# Patient Record
Sex: Female | Born: 2005 | Hispanic: Yes | Marital: Single | Smoking: Never smoker
Health system: Southern US, Community
[De-identification: ages and names within clinical notes are randomized; demographics above are authoritative.]

## PROBLEM LIST (undated history)

## (undated) DIAGNOSIS — J45909 Unspecified asthma, uncomplicated: Secondary | ICD-10-CM

## (undated) HISTORY — PX: NO PAST SURGERIES: SHX2092

---
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
## 2024-02-10 ENCOUNTER — Other Ambulatory Visit: Payer: Self-pay | Admitting: Obstetrics

## 2024-02-17 ENCOUNTER — Other Ambulatory Visit: Payer: Self-pay | Admitting: Obstetrics

## 2024-02-17 NOTE — Telephone Encounter (Signed)
Pt needs to schedule annual

## 2024-04-03 ENCOUNTER — Encounter: Payer: Self-pay | Admitting: Obstetrics

## 2024-04-03 ENCOUNTER — Other Ambulatory Visit (HOSPITAL_COMMUNITY)
Admission: RE | Admit: 2024-04-03 | Discharge: 2024-04-03 | Disposition: A | Discharge status: Home / Self Care | Source: Ambulatory Visit | Attending: Obstetrics | Admitting: Obstetrics

## 2024-04-03 ENCOUNTER — Ambulatory Visit (INDEPENDENT_AMBULATORY_CARE_PROVIDER_SITE_OTHER): Admitting: Obstetrics

## 2024-04-03 VITALS — BP 115/62 | HR 63 | Ht 64.0 in | Wt 157.0 lb

## 2024-04-03 DIAGNOSIS — N898 Other specified noninflammatory disorders of vagina: Secondary | ICD-10-CM | POA: Insufficient documentation

## 2024-04-03 NOTE — Progress Notes (Signed)
    GYNECOLOGY PROGRESS NOTE  Subjective:  PCP: Francy Ishikawa, MD  Patient ID: Erin Rhodes, female    DOB: [birth year], 18 y.o.   MRN: [id number]  HPI  Patient is a 18 y.o. G0P0000 female who presents for vaginal itching started Tuesday, suspects she has a yeast infection, had one last year and the symptoms are the same. No discharge or odor. She also has some "cracking" on her perineal skin that is painful, especially when she wipes. Requesting to self-swab and declines pelvic exam today.   The following portions of the patient's history were reviewed and updated as appropriate: allergies, current medications, past family history, past medical history, past social history, past surgical history, and problem list.  Review of Systems Pertinent items are noted in HPI.   Objective:   Blood pressure 115/62, pulse 63, height 5\' 4"  (1.626 m), weight 157 lb (71.2 kg), last menstrual period 03/15/2024. Body mass index is 26.95 kg/m.  General appearance: alert and cooperative Pelvic: deferred Extremities: extremities normal, atraumatic, no cyanosis or edema Neurologic: Grossly normal   Assessment/Plan:   1. Vaginal itching   -Self-swab done today per pt's preference -Will await results for treatment; discussed OTC Monistat if wanting to treat sooner -Pericare for cracked perineal skin discussed, did not examine per pt's preference  -Follow up if symptoms not improving    Sofia Dunn, DO [facility]

## 2024-04-05 LAB — CERVICOVAGINAL ANCILLARY ONLY
Bacterial Vaginitis (gardnerella): NEGATIVE
Candida Glabrata: NEGATIVE
Candida Vaginitis: POSITIVE — AB
Chlamydia: NEGATIVE
Comment: NEGATIVE
Comment: NEGATIVE
Comment: NEGATIVE
Comment: NEGATIVE
Comment: NEGATIVE
Comment: NORMAL
Neisseria Gonorrhea: NEGATIVE
Trichomonas: NEGATIVE

## 2024-04-06 ENCOUNTER — Other Ambulatory Visit: Payer: Self-pay | Admitting: Obstetrics

## 2024-04-06 ENCOUNTER — Encounter: Payer: Self-pay | Admitting: Obstetrics

## 2024-04-06 DIAGNOSIS — B3731 Acute candidiasis of vulva and vagina: Secondary | ICD-10-CM

## 2024-04-06 MED ORDER — FLUCONAZOLE 150 MG PO TABS: 150.0000 mg | ORAL_TABLET | ORAL | 0 refills | Status: AC

## 2024-04-06 NOTE — Progress Notes (Signed)
 Yeast infection, Rx for Diflucan

## 2024-04-24 ENCOUNTER — Encounter: Payer: Self-pay | Admitting: Emergency Medicine

## 2024-04-24 ENCOUNTER — Emergency Department
Admission: EM | Admit: 2024-04-24 | Discharge: 2024-04-24 | Disposition: A | Discharge status: Home / Self Care | Attending: Emergency Medicine | Admitting: Emergency Medicine

## 2024-04-24 ENCOUNTER — Emergency Department

## 2024-04-24 DIAGNOSIS — W1839XA Other fall on same level, initial encounter: Secondary | ICD-10-CM | POA: Diagnosis not present

## 2024-04-24 DIAGNOSIS — S62314A Displaced fracture of base of fourth metacarpal bone, right hand, initial encounter for closed fracture: Secondary | ICD-10-CM | POA: Insufficient documentation

## 2024-04-24 DIAGNOSIS — Y9389 Activity, other specified: Secondary | ICD-10-CM | POA: Diagnosis not present

## 2024-04-24 DIAGNOSIS — S6391XA Sprain of unspecified part of right wrist and hand, initial encounter: Secondary | ICD-10-CM

## 2024-04-24 DIAGNOSIS — M79641 Pain in right hand: Secondary | ICD-10-CM | POA: Diagnosis present

## 2024-04-24 MED ORDER — IBUPROFEN 600 MG PO TABS
600.0000 mg | ORAL_TABLET | Freq: Once | ORAL | Status: AC
  Administered 2024-04-24: 600 mg via ORAL
  Filled 2024-04-24: qty 1

## 2024-04-24 NOTE — Discharge Instructions (Signed)
 Please wear a Velcro wrist brace at all times until you follow-up with orthopedist.  You may remove brace to shower.  Alternate Tylenol  and ibuprofen as needed for pain.  You may apply ice 20 minutes every hour to the right hand to help with swelling.

## 2024-04-24 NOTE — ED Provider Notes (Signed)
 [place] [facility] DEPARTMENT AT [facility] Provider Note   CSN: [id number] Arrival date & time: 04/24/24  2141     History  Chief Complaint  Patient presents with   Hand Injury    Erin Rhodes is a 18 y.o. female presents to the emergency department for evaluation of right hand pain.  Patient states earlier today she was riding a mechanical bull, had her right hand called and the strap of the bowl and twisted her right hand.  She has pain and swelling to the dorsum of the right hand, most the pain along the base of the metacarpals.  No open injuries.  Sensation is intact distally.  No other injury  HPI     Home Medications Prior to Admission medications   Medication Sig Start Date End Date Taking? Authorizing Provider  fluconazole  (DIFLUCAN ) 150 MG tablet Take 1 tablet (150 mg total) by mouth every 3 (three) days. 04/06/24   Sofia Dunn, MD  albuterol  (VENTOLIN  HFA) 108 (90 Base) MCG/ACT inhaler Inhale 2 puffs into the lungs every 6 (six) hours as needed for shortness of breath. 11/29/20   Menshew, Raye Cai, PA-C  etonogestrel -ethinyl estradiol  (HALOETTE) 0.12-0.015 MG/24HR vaginal ring INSERT 1 RING VAGINALLY FOR 3 WEEKS THEN REMOVE FOR 1 WEEK 02/17/24   Phylliss Brenner, CNM      Allergies    Patient has no known allergies.    Review of Systems   Review of Systems  Physical Exam Updated Vital Signs BP 110/67   Pulse 93   Temp 98.3 F (36.8 C) (Oral)   Resp 18   Ht 5\' 4"  (1.626 m)   Wt 71.2 kg   LMP 04/16/2024 (Exact Date)   SpO2 100%   BMI 26.94 kg/m  Physical Exam Constitutional:      Appearance: She is well-developed.  HENT:     Head: Normocephalic and atraumatic.  Eyes:     Conjunctiva/sclera: Conjunctivae normal.  Cardiovascular:     Rate and Rhythm: Normal rate.  Pulmonary:     Effort: Pulmonary effort is normal. No respiratory distress.  Musculoskeletal:        General: Normal range of motion.     Cervical back: Normal range of  motion.     Comments: Right hand with swelling along the dorsum along the metacarpals.  Tenderness along the base of the fourth metacarpal most significant.  Normal active extension and flexion of the digits.  Sensation is intact distally.  2+ radial pulse.  No scaphoid tenderness.  No deformity.  Skin:    General: Skin is warm.     Findings: No rash.  Neurological:     Mental Status: She is alert and oriented to person, place, and time.  Psychiatric:        Behavior: Behavior normal.        Thought Content: Thought content normal.     ED Results / Procedures / Treatments   Labs (all labs ordered are listed, but only abnormal results are displayed) Labs Reviewed - No data to display  EKG None  Radiology DG Wrist Complete Right Result Date: 04/24/2024 CLINICAL DATA:  Pain EXAM: RIGHT WRIST - COMPLETE 3+ VIEW COMPARISON:  None Available. FINDINGS: There is no evidence of fracture or dislocation. There is no evidence of arthropathy or other focal bone abnormality. There is soft tissue swelling surrounding the wrist. A IMPRESSION: 1. No acute fracture or dislocation. 2. Soft tissue swelling surrounding the wrist. Electronically Signed   By:  Tyron Gallon M.D.   On: 04/24/2024 22:27   DG Hand Complete Right Result Date: 04/24/2024 CLINICAL DATA:  Pain EXAM: RIGHT HAND - COMPLETE 3+ VIEW COMPARISON:  None Available. FINDINGS: There is soft tissue swelling over the dorsum of the hand. There is a tiny ossific fragment on the lateral view adjacent to the proximal aspect of the metacarpals which may represent small avulsion fracture. This is not well appreciated on the other views. The joint spaces are well maintained and alignment is anatomic. IMPRESSION: 1. Soft tissue swelling over the dorsum of the hand. 2. Tiny ossific fragment on the lateral view adjacent to the proximal aspect of the metacarpals which may represent small avulsion fracture. Electronically Signed   By: Tyron Gallon M.D.   On:  04/24/2024 22:25    Procedures Procedures    Medications Ordered in ED Medications  ibuprofen (ADVIL) tablet 600 mg (has no administration in time range)    ED Course/ Medical Decision Making/ A&P                                 Medical Decision Making Amount and/or Complexity of Data Reviewed Radiology: ordered.  Risk Prescription drug management.   18 year old female with right hand sprain and possible acute avulsion fracture along the base of the fourth metacarpal.  She is placed into a Velcro wrist brace today.  Will alternate Tylenol  and ibuprofen as needed for pain.  She will call orthopedics to schedule follow-up appointment.  Recommend rest ice and elevation over the next few days.  She understands signs symptoms return to the ED for. Final Clinical Impression(s) / ED Diagnoses Final diagnoses:  Hand sprain, right, initial encounter  Closed displaced fracture of base of fourth metacarpal bone of right hand, initial encounter    Rx / DC Orders ED Discharge Orders     None         Faron Tudisco C, PA-C 04/24/24 2237    Shane Darling, MD 04/24/24 2242

## 2024-04-24 NOTE — ED Triage Notes (Signed)
 Pt presents to the ED via POV with complaints of R hand pain following a fall off a mechanical bull. Pt states she tried to catch herself with her R hand. Visible swelling noted to the anterior side of her hand. Cap refill <3 secs. A&Ox4 at this time. Denies hitting her head, LOC, CP or SOB.

## 2024-04-27 ENCOUNTER — Other Ambulatory Visit: Payer: Self-pay

## 2024-04-27 DIAGNOSIS — Z021 Encounter for pre-employment examination: Secondary | ICD-10-CM

## 2024-04-27 NOTE — Progress Notes (Signed)
 Pt completed and cleared pre-employment UDS.

## 2024-06-13 ENCOUNTER — Ambulatory Visit: Admitting: Licensed Practical Nurse

## 2024-06-13 ENCOUNTER — Encounter: Payer: Self-pay | Admitting: Licensed Practical Nurse

## 2024-06-13 VITALS — BP 110/70 | HR 94 | Ht 63.0 in | Wt 154.2 lb

## 2024-06-13 DIAGNOSIS — L739 Follicular disorder, unspecified: Secondary | ICD-10-CM

## 2024-06-13 MED ORDER — CEPHALEXIN 500 MG PO CAPS: 500.0000 mg | ORAL_CAPSULE | Freq: Four times a day (QID) | ORAL | 0 refills | Status: AC

## 2024-06-13 NOTE — Progress Notes (Addendum)
 Patient, No Pcp Per   No chief complaint on file.   HPI:      Erin Rhodes is a 18 y.o. G0P0000 whose LMP was Patient's last menstrual period was 06/05/2024 (exact date)., presents today for evaluation of hard mass on labia majora. Reports having noticed it last Wednesday as a small pimple like and it has gradually grown, reports its painful to touch and sore. Just one spot. Reports Erin Rhodes is sexually active, 1 partner, using condoms most of the time. Declines STI testing today, had labs done in April and all negative.     Patient Active Problem List   Diagnosis Date Noted   Amenorrhea 11/18/2022    Past Surgical History:  Procedure Laterality Date   NO PAST SURGERIES      No family history on file.  Social History   Socioeconomic History   Marital status: Single    Spouse name: Not on file   Number of children: Not on file   Years of education: Not on file   Highest education level: Not on file  Occupational History   Not on file  Tobacco Use   Smoking status: Never   Smokeless tobacco: Never  Vaping Use   Vaping status: Some Days  Substance and Sexual Activity   Alcohol use: Yes   Drug use: No   Sexual activity: Yes    Birth control/protection: Inserts, Condom  Other Topics Concern   Not on file  Social History Narrative   Not on file   Social Drivers of Health   Financial Resource Strain: Not on file  Food Insecurity: Not on file  Transportation Needs: Not on file  Physical Activity: Not on file  Stress: Not on file  Social Connections: Not on file  Intimate Partner Violence: Not on file    Outpatient Medications Prior to Visit  Medication Sig Dispense Refill   albuterol  (VENTOLIN  HFA) 108 (90 Base) MCG/ACT inhaler Inhale 2 puffs into the lungs every 6 (six) hours as needed for shortness of breath. 6.7 g 0   etonogestrel -ethinyl estradiol  (HALOETTE) 0.12-0.015 MG/24HR vaginal ring INSERT 1 RING VAGINALLY FOR 3 WEEKS THEN REMOVE FOR 1 WEEK 4  each 0   fluconazole  (DIFLUCAN ) 150 MG tablet Take 1 tablet (150 mg total) by mouth every 3 (three) days. (Patient not taking: Reported on 06/13/2024) 2 tablet 0   No facility-administered medications prior to visit.      ROS:  Review of Systems  Constitutional: Negative.   HENT: Negative.    Gastrointestinal: Negative.   Endocrine: Negative.   Genitourinary: Negative.   Musculoskeletal: Negative.   Skin:  Positive for wound.       On labia majora   Allergic/Immunologic: Negative.   Neurological: Negative.   Hematological: Negative.   Psychiatric/Behavioral: Negative.       OBJECTIVE:   Vitals:  BP 110/70 (BP Location: Left Arm, Patient Position: Sitting, Cuff Size: Normal)   Pulse 94   Ht 5' 3 (1.6 m)   Wt 154 lb 3.2 oz (69.9 kg)   LMP 06/05/2024 (Exact Date)   BMI 27.32 kg/m   Physical Exam HENT:     Head: Normocephalic.     Nose: Nose normal.     Mouth/Throat:     Mouth: Mucous membranes are dry.   Eyes:     Pupils: Pupils are equal, round, and reactive to light.    Cardiovascular:     Rate and Rhythm: Normal rate.  Pulmonary:  Effort: Pulmonary effort is normal.  Abdominal:     General: Abdomen is flat.     Palpations: Abdomen is soft.  Genitourinary:    Rectum: Normal.     Comments: Wound appears as cyst filled, in grown hair on labia majora   Musculoskeletal:     Cervical back: Normal range of motion and neck supple.   Skin:    General: Skin is warm and dry.   Neurological:     Mental Status: Erin Rhodes is alert and oriented to person, place, and time.   Psychiatric:        Mood and Affect: Mood normal.        Behavior: Behavior normal.     Results: No results found for this or any previous visit (from the past 24 hours).   Assessment/Plan: Folliculitis - Plan: cephALEXin (KEFLEX) 500 MG capsule  Discussed warm compressed to the site, massages and applying black seed oil as anti-inflammatory.  Plan to monitor to improvement, if  worsen or does not improve with interventions, plan for PO antibiotics.    Meds ordered this encounter  Medications   cephALEXin (KEFLEX) 500 MG capsule    Sig: Take 1 capsule (500 mg total) by mouth 4 (four) times daily for 7 days.    Dispense:  28 capsule    Refill:  0     JINNIE HERO Srah Ake, CNM 06/13/2024 4:06 PM

## 2024-07-11 ENCOUNTER — Encounter: Payer: Self-pay | Admitting: Licensed Practical Nurse

## 2024-07-11 ENCOUNTER — Ambulatory Visit: Admitting: Licensed Practical Nurse

## 2024-07-11 VITALS — BP 113/78 | HR 101 | Ht 63.0 in | Wt 155.7 lb

## 2024-07-11 DIAGNOSIS — Z01419 Encounter for gynecological examination (general) (routine) without abnormal findings: Secondary | ICD-10-CM | POA: Diagnosis not present

## 2024-07-11 DIAGNOSIS — Z3040 Encounter for surveillance of contraceptives, unspecified: Secondary | ICD-10-CM

## 2024-07-11 MED ORDER — ETONOGESTREL-ETHINYL ESTRADIOL 0.12-0.015 MG/24HR VA RING: VAGINAL_RING | VAGINAL | 3 refills | Status: AC

## 2024-07-11 NOTE — Progress Notes (Addendum)
 Gynecology Annual Exam  PCP: Patient, No Pcp Per  Chief Complaint:  Chief Complaint  Patient presents with   Gynecologic Exam    History of Present Illness: Patient is a 18 y.o. G0P0000 presents for annual exam. The patient has no complaints today. She would like her contraception renewed. She has a fear of needles therefore declines any lab work or injections today.   LMP: Patient's last menstrual period was 06/05/2024 (exact date). Average Interval: regular, monthly Duration of flow: 3-4 days Heavy Menses: yes first 2 days changes tampon every 4  hours Clots: yes first 2 days Intermenstrual Bleeding: no Postcoital Bleeding: no Dysmenorrhea: no  The patient is sexually active with 1 female partner. She currently uses NuvaRing vaginal inserts for contraception. She denies dyspareunia-but has noticed vaginal dryness since starting the NUvaRing.  The patient does perform self breast exams.  There is no notable family history of breast or ovarian cancer in her family.  The patient wears seatbelts: yes.  The patient has regular exercise: yes.  Walking and gym, is working to lose weight, had a set back d/t an ankle and wrist injury.  The patient denies current symptoms of depression.   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   HENT: Negative.    Eyes: Negative.   Respiratory: Negative.    Cardiovascular: Negative.   Skin: Negative.   Neurological: Negative.   Endo/Heme/Allergies: Negative.   Psychiatric/Behavioral: Negative.      Past Medical History:  Patient Active Problem List   Diagnosis Date Noted   Amenorrhea 11/18/2022    Past Surgical History:  Past Surgical History:   Procedure Laterality Date   NO PAST SURGERIES      Gynecologic History:  Patient's last menstrual period was 06/05/2024 (exact date). Contraception: NuvaRing vaginal inserts Last Pap: Results were: due at age 46    Obstetric History: G0P0000  Family History:  History reviewed. No pertinent family history.  Social History:  Social History   Socioeconomic History   Marital status: Single    Spouse name: Not on file   Number of children: Not on file   Years of education: Not on file   Highest education level: Not on file  Occupational History   Not on file  Tobacco Use   Smoking status: Never   Smokeless tobacco: Never  Vaping Use   Vaping status: Some Days  Substance and Sexual Activity   Alcohol use: Yes   Drug use: No   Sexual activity: Yes    Birth control/protection: Inserts, Condom  Other Topics Concern   Not on file  Social History Narrative   Not on file   Social Drivers of Health   Financial Resource Strain: Not on file  Food Insecurity: Not on file  Transportation Needs: Not on file  Physical Activity: Not on file  Stress: Not on file  Social Connections: Not on file  Intimate Partner Violence: Not on file    Allergies:  No Known Allergies  Medications: Prior to Admission medications   Medication Sig Start Date End Date Taking? Authorizing Provider  albuterol  (VENTOLIN  HFA) 108 (90 Base) MCG/ACT inhaler Inhale 2 puffs into the lungs every  6 (six) hours as needed for shortness of breath. 11/29/20  Yes Menshew, Candida LULLA Kings, PA-C  etonogestrel -ethinyl estradiol  (HALOETTE) 0.12-0.015 MG/24HR vaginal ring Insert vaginally and leave in place for 3 consecutive weeks, then remove for 1 week. 07/11/24   Dymond Gutt, Jinnie Jansky, CNM  fluconazole  (DIFLUCAN ) 150 MG tablet Take 1 tablet (150 mg total) by mouth every 3 (three) days. Patient not taking: Reported on 07/11/2024 04/06/24   Leigh Sober, MD    Physical Exam Vitals: Blood pressure 113/78, pulse (!)  101, height 5' 3 (1.6 m), weight 155 lb 11.2 oz (70.6 kg), last menstrual period 06/05/2024.  General: NAD HEENT: normocephalic, anicteric Thyroid: no enlargement, no palpable nodules Pulmonary: No increased work of breathing, CTAB Cardiovascular: RRR, distal pulses 2+ Breast: Breast symmetrical, no tenderness, no palpable nodules or masses, no skin or nipple retraction present, no nipple discharge.  No axillary or supraclavicular lymphadenopathy. Left breast-area of redness noted under breast consistent with heat rash-pt states she was at [facility] in the heat.  Abdomen: NABS, soft, non-tender, non-distended.  Umbilicus without lesions.  No hepatomegaly, splenomegaly or masses palpable. No evidence of hernia  Genitourinary: exam deferred   External:   Vagina:     Cervix:   Uterus:   Adnexa:   Rectal: deferred  Lymphatic: no evidence of inguinal lymphadenopathy Extremities: no edema, erythema, or tenderness Neurologic: Grossly intact Psychiatric: mood appropriate, affect full  Assessment: 18 y.o. G0P0000 routine annual exam  Plan: Problem List Items Addressed This Visit   None Visit Diagnoses       Well woman exam    -  Primary     Encounter for surveillance of contraceptives, unspecified contraceptive           1) 4) Gardasil Series discussed and if applicable offered to patient - Patient has previously completed 3 shot series   2) STI screening  wasoffered and declined  3)  ASCCP guidelines and rational discussed.  Patient opts for start at age 37  screening interval  4) Contraception - the patient is currently using  NuvaRing vaginal inserts.  She is happy with her current form of contraception and plans to continue We discussed safe sex practices to reduce her furture risk of STI's.    5) Return in about 1 year (around 07/11/2025).   Jinnie Cookey, CNM  [facility] 07/11/24  9:02 AM '

## 2024-08-12 ENCOUNTER — Other Ambulatory Visit: Payer: Self-pay | Admitting: Obstetrics
